# Patient Record
Sex: Male | Born: 1975 | Race: White | Hispanic: No | Marital: Married | Smoking: Never smoker
Health system: Southern US, Community
[De-identification: ages and names within clinical notes are randomized; demographics above are authoritative.]

## PROBLEM LIST (undated history)

## (undated) DIAGNOSIS — Z9089 Acquired absence of other organs: Secondary | ICD-10-CM

## (undated) HISTORY — DX: Acquired absence of other organs: Z90.89

## (undated) HISTORY — PX: TONSILLECTOMY: SUR1361

---

## 2018-07-22 ENCOUNTER — Ambulatory Visit: Payer: Self-pay | Admitting: Adult Health

## 2018-07-22 ENCOUNTER — Encounter: Payer: Self-pay | Admitting: Adult Health

## 2018-07-22 VITALS — BP 116/90 | HR 101 | Temp 99.4°F | Resp 16 | Ht 71.0 in | Wt 211.0 lb

## 2018-07-22 DIAGNOSIS — J101 Influenza due to other identified influenza virus with other respiratory manifestations: Secondary | ICD-10-CM

## 2018-07-22 DIAGNOSIS — R6889 Other general symptoms and signs: Secondary | ICD-10-CM

## 2018-07-22 LAB — POCT INFLUENZA A/B
Influenza A, POC: POSITIVE — AB
Influenza B, POC: NEGATIVE

## 2018-07-22 MED ORDER — OSELTAMIVIR PHOSPHATE 75 MG PO CAPS: 75.0000 mg | ORAL_CAPSULE | Freq: Two times a day (BID) | ORAL | 0 refills | Status: DC

## 2018-07-22 MED ORDER — BENZONATATE 100 MG PO CAPS: 100.0000 mg | ORAL_CAPSULE | Freq: Two times a day (BID) | ORAL | 0 refills | Status: DC | PRN

## 2018-07-22 NOTE — Patient Instructions (Addendum)
Advised patient call the office or your primary care doctor for an appointment if no improvement within 72 hours or if any symptoms change or worsen at any time  Advised ER or urgent Care if after hours or on weekend. Call 911 for emergency symptoms at any time.Patinet verbalized understanding of all instructions given/reviewed and treatment plan and has no further questions or concerns at this time.      Influenza, Adult Influenza is also called "the flu." It is an infection in the lungs, nose, and throat (respiratory tract). It is caused by a virus. The flu causes symptoms that are similar to symptoms of a cold. It also causes a high fever and body aches. The flu spreads easily from person to person (is contagious). Getting a flu shot (influenza vaccination) every year is the best way to prevent the flu. What are the causes? This condition is caused by the influenza virus. You can get the virus by:  Breathing in droplets that are in the air from the cough or sneeze of a person who has the virus.  Touching something that has the virus on it (is contaminated) and then touching your mouth, nose, or eyes. What increases the risk? Certain things may make you more likely to get the flu. These include:  Not washing your hands often.  Having close contact with many people during cold and flu season.  Touching your mouth, eyes, or nose without first washing your hands.  Not getting a flu shot every year. You may have a higher risk for the flu, along with serious problems such as a lung infection (pneumonia), if you:  Are older than 65.  Are pregnant.  Have a weakened disease-fighting system (immune system) because of a disease or taking certain medicines.  Have a long-term (chronic) illness, such as: ? Heart, kidney, or lung disease. ? Diabetes. ? Asthma.  Have a liver disorder.  Are very overweight (morbidly obese).  Have anemia. This is a condition that affects your red blood  cells. What are the signs or symptoms? Symptoms usually begin suddenly and last 4-14 days. They may include:  Fever and chills.  Headaches, body aches, or muscle aches.  Sore throat.  Cough.  Runny or stuffy (congested) nose.  Chest discomfort.  Not wanting to eat as much as normal (poor appetite).  Weakness or feeling tired (fatigue).  Dizziness.  Feeling sick to your stomach (nauseous) or throwing up (vomiting). How is this treated? If the flu is found early, you can be treated with medicine that can help reduce how bad the illness is and how long it lasts (antiviral medicine). This may be given by mouth (orally) or through an IV tube. Taking care of yourself at home can help your symptoms get better. Your doctor may suggest:  Taking over-the-counter medicines.  Drinking plenty of fluids. The flu often goes away on its own. If you have very bad symptoms or other problems, you may be treated in a hospital. Follow these instructions at home:     Activity  Rest as needed. Get plenty of sleep.  Stay home from work or school as told by your doctor. ? Do not leave home until you do not have a fever for 24 hours without taking medicine. ? Leave home only to visit your doctor. Eating and drinking  Take an ORS (oral rehydration solution). This is a drink that is sold at pharmacies and stores.  Drink enough fluid to keep your pee (urine) pale yellow.  Drink clear fluids in small amounts as you are able. Clear fluids include: ? Water. ? Ice chips. ? Fruit juice that has water added (diluted fruit juice). ? Low-calorie sports drinks.  Eat bland, easy-to-digest foods in small amounts as you are able. These foods include: ? Bananas. ? Applesauce. ? Rice. ? Lean meats. ? Toast. ? Crackers.  Do not eat or drink: ? Fluids that have a lot of sugar or caffeine. ? Alcohol. ? Spicy or fatty foods. General instructions  Take over-the-counter and prescription medicines  only as told by your doctor.  Use a cool mist humidifier to add moisture to the air in your home. This can make it easier for you to breathe.  Cover your mouth and nose when you cough or sneeze.  Wash your hands with soap and water often, especially after you cough or sneeze. If you cannot use soap and water, use alcohol-based hand sanitizer.  Keep all follow-up visits as told by your doctor. This is important. How is this prevented?   Get a flu shot every year. You may get the flu shot in late summer, fall, or winter. Ask your doctor when you should get your flu shot.  Avoid contact with people who are sick during fall and winter (cold and flu season). Contact a doctor if:  You get new symptoms.  You have: ? Chest pain. ? Watery poop (diarrhea). ? A fever.  Your cough gets worse.  You start to have more mucus.  You feel sick to your stomach.  You throw up. Get help right away if you:  Have shortness of breath.  Have trouble breathing.  Have skin or nails that turn a bluish color.  Have very bad pain or stiffness in your neck.  Get a sudden headache.  Get sudden pain in your face or ear.  Cannot eat or drink without throwing up. Summary  Influenza ("the flu") is an infection in the lungs, nose, and throat. It is caused by a virus.  Take over-the-counter and prescription medicines only as told by your doctor.  Getting a flu shot every year is the best way to avoid getting the flu. This information is not intended to replace advice given to you by your health care provider. Make sure you discuss any questions you have with your health care provider. Document Released: 04/01/2008 Document Revised: 12/09/2017 Document Reviewed: 12/09/2017 Elsevier Interactive Patient Education  2019 [employer] Preventing Influenza, Adult Influenza, more commonly known as "the flu," is a viral infection that mainly affects the respiratory tract. The respiratory tract includes  structures that help you breathe, such as the lungs, nose, and throat. The flu causes many common cold symptoms, as well as a high fever and body aches. The flu spreads easily from person to person (is contagious). The flu is most common from December through March. This is called flu season.You can catch the flu virus by:  Breathing in droplets from an infected person's cough or sneeze.  Touching something that was recently contaminated with the virus and then touching your mouth, nose, or eyes. What can I do to lower my risk?        You can decrease your risk of getting the flu by:  Getting a flu shot (influenza vaccination) every year. This is the best way to prevent the flu. A flu shot is recommended for everyone age 45 months and older. ? It is best to get a flu shot in the fall, as soon as  it is available. Getting a flu shot during winter or spring instead is still a good idea. Flu season can last into early spring. ? Preventing the flu through vaccination requires getting a new flu shot every year. This is because the flu virus changes slightly (mutates) from one year to the next. Even if a flu shot does not completely protect you from all flu virus mutations, it can reduce the severity of your illness and prevent dangerous complications of the flu. ? If you are pregnant, you can and should get a flu shot. ? If you have had a reaction to the shot in the past or if you are allergic to eggs, check with your health care provider before getting a flu shot. ? Sometimes the vaccine is available as a nasal spray. In some years, the nasal spray has not been as effective against the flu virus. Check with your health care provider if you have questions about this.  Practicing good health habits. This is especially important during flu season. ? Avoid contact with people who are sick with flu or cold symptoms. ? Wash your hands with soap and water often. If soap and water are not available, use  alcohol-based hand sanitizer. ? Avoid touching your hands to your face, especially when you have not washed your hands recently. ? Use a disinfectant to clean surfaces at home and at work that may be contaminated with the flu virus. ? Keep your body's disease-fighting system (immune system) in good shape by eating a healthy diet, drinking plenty of fluids, getting enough sleep, and exercising regularly. If you do get the flu, avoid spreading it to others by:  Staying home until your symptoms have been gone for at least one day.  Covering your mouth and nose when you cough or sneeze.  Avoiding close contact with others, especially babies and elderly people. Why are these changes important? Getting a flu shot and practicing good health habits protects you as well as other people. If you get the flu, your friends, family, and co-workers are also at risk of getting it, because it spreads so easily to others. Each year, about 2 out of every 10 people get the flu. Having the flu can lead to complications, such as pneumonia, ear infection, and sinus infection. The flu also can be deadly, especially for babies, people older than age 19, and people who have serious long-term diseases. How is this treated? Most people recover from the flu by resting at home and drinking plenty of fluids. However, a prescription antiviral medicine may reduce your flu symptoms and may make your flu go away sooner. This medicine must be started within a few days of getting flu symptoms. You can talk with your health care provider about whether you need an antiviral medicine. Antiviral medicine may be prescribed for people who are at risk for more serious flu symptoms. This includes people who:  Are older than age 54.  Are pregnant.  Have a condition that makes the flu worse or more dangerous. Where to find more information  Centers for Disease Control and Prevention: tsavxtf.com  ItsBlog.fr:  InternetEnthusiasts.hu  American Academy of [employer] Physicians: familydoctor.org/familydoctor/en/kids/vaccines/preventing-the-flu.html Contact a health care provider if:  You have influenza and you develop new symptoms.  You have: ? Chest pain. ? Diarrhea. ? A fever.  Your cough gets worse, or you produce more mucus. Summary  The best way to prevent the flu is to get a flu shot every year in the fall.  Even if you get the flu after you have received the yearly vaccine, your flu may be milder and go away sooner because of your flu shot.  If you get the flu, antiviral medicines that are started with a few days of symptoms may reduce your flu symptoms and may make your flu go away sooner.  You can also help prevent the flu by practicing good health habits. This information is not intended to replace advice given to you by your health care provider. Make sure you discuss any questions you have with your health care provider. Document Released: 07/08/2015 Document Revised: 05/22/2017 Document Reviewed: 03/01/2016 Elsevier Interactive Patient Education  2019 [employer] Inc. Oseltamivir capsules What is this medicine? OSELTAMIVIR (os el TAM i vir) is an antiviral medicine. It is used to prevent and to treat some kinds of influenza or the flu. It will not work for colds or other viral infections. This medicine may be used for other purposes; ask your health care provider or pharmacist if you have questions. COMMON BRAND NAME(S): Tamiflu What should I tell my health care provider before I take this medicine? They need to know if you have any of the following conditions: -heart disease -immune system problems -kidney disease -liver disease -lung disease -an unusual or allergic reaction to oseltamivir, other medicines, foods, dyes, or preservatives -pregnant or trying to get pregnant -breast-feeding How should I use this medicine? Take this medicine by mouth with a glass of  water. Follow the directions on the prescription label. Start this medicine at the first sign of flu symptoms. You can take it with or without food. If it upsets your stomach, take it with food. Take your medicine at regular intervals. Do not take your medicine more often than directed. Take all of your medicine as directed even if you think you are better. Do not skip doses or stop your medicine early. Talk to your pediatrician regarding the use of this medicine in children. While this drug may be prescribed for children as young as 14 days for selected conditions, precautions do apply. Overdosage: If you think you have taken too much of this medicine contact a poison control center or emergency room at once. NOTE: This medicine is only for you. Do not share this medicine with others. What if I miss a dose? If you miss a dose, take it as soon as you remember. If it is almost time for your next dose (within 2 hours), take only that dose. Do not take double or extra doses. What may interact with this medicine? - intranasal influenza vaccine This list may not describe all possible interactions. Give your health care provider a list of all the medicines, herbs, non-prescription drugs, or dietary supplements you use. Also tell them if you smoke, drink alcohol, or use illegal drugs. Some items may interact with your medicine. What should I watch for while using this medicine? Visit your doctor or health care professional for regular check ups. Tell your doctor if your symptoms do not start to get better or if they get worse. If you have the flu, you may be at an increased risk of developing seizures, confusion, or abnormal behavior. This occurs early in the illness, and more frequently in children and teens. These events are not common, but may result in accidental injury to the patient. Families and caregivers of patients should watch for signs of unusual behavior and contact a doctor or health care  professional right away if the patient shows signs  of unusual behavior. This medicine is not a substitute for the flu shot. Talk to your doctor each year about an annual flu shot. What side effects may I notice from receiving this medicine? Side effects that you should report to your doctor or health care professional as soon as possible: -allergic reactions like skin rash, itching or hives, swelling of the face, lips, or tongue -anxiety, confusion, unusual behavior -breathing problems -hallucination, loss of contact with reality -redness, blistering, peeling or loosening of the skin, including inside the mouth -seizures Side effects that usually do not require medical attention (report to your doctor or health care professional if they continue or are bothersome): -diarrhea -headache -nausea, vomiting -pain This list may not describe all possible side effects. Call your doctor for medical advice about side effects. You may report side effects to FDA at 1-800-FDA-1088. Where should I keep my medicine? Keep out of the reach of children. Store at room temperature between 15 and 30 degrees C (59 and 86 degrees F). Throw away any unused medicine after the expiration date. NOTE: This sheet is a summary. It may not cover all possible information. If you have questions about this medicine, talk to your doctor, pharmacist, or health care provider.  2019 Elsevier/Gold Standard (2016-12-03 16:45:14) Benzonatate capsules What is this medicine? BENZONATATE (ben ZOE na tate) is used to treat cough. This medicine may be used for other purposes; ask your health care provider or pharmacist if you have questions. COMMON BRAND NAME(S): Tessalon Perles, Zonatuss What should I tell my health care provider before I take this medicine? They need to know if you have any of these conditions: -kidney or liver disease -an unusual or allergic reaction to benzonatate, anesthetics, other medicines, foods, dyes, or  preservatives -pregnant or trying to get pregnant -breast-feeding How should I use this medicine? Take this medicine by mouth with a glass of water. Follow the directions on the prescription label. Avoid breaking, chewing, or sucking the capsule, as this can cause serious side effects. Take your medicine at regular intervals. Do not take your medicine more often than directed. Talk to your pediatrician regarding the use of this medicine in children. While this drug may be prescribed for children as young as 41 years old for selected conditions, precautions do apply. Overdosage: If you think you have taken too much of this medicine contact a poison control center or emergency room at once. NOTE: This medicine is only for you. Do not share this medicine with others. What if I miss a dose? If you miss a dose, take it as soon as you can. If it is almost time for your next dose, take only that dose. Do not take double or extra doses. What may interact with this medicine? Do not take this medicine with any of the following medications: -MAOIs like Carbex, Eldepryl, Marplan, Nardil, and Parnate This list may not describe all possible interactions. Give your health care provider a list of all the medicines, herbs, non-prescription drugs, or dietary supplements you use. Also tell them if you smoke, drink alcohol, or use illegal drugs. Some items may interact with your medicine. What should I watch for while using this medicine? Tell your doctor if your symptoms do not improve or if they get worse. If you have a high fever, skin rash, or headache, see your health care professional. You may get drowsy or dizzy. Do not drive, use machinery, or do anything that needs mental alertness until you know how this medicine  affects you. Do not sit or stand up quickly, especially if you are an older patient. This reduces the risk of dizzy or fainting spells. What side effects may I notice from receiving this  medicine? Side effects that you should report to your doctor or health care professional as soon as possible: -allergic reactions like skin rash, itching or hives, swelling of the face, lips, or tongue -breathing problems -chest pain -confusion or hallucinations -irregular heartbeat -numbness of mouth or throat -seizures Side effects that usually do not require medical attention (report to your doctor or health care professional if they continue or are bothersome): -burning feeling in the eyes -constipation -headache -nasal congestion -stomach upset This list may not describe all possible side effects. Call your doctor for medical advice about side effects. You may report side effects to FDA at 1-800-FDA-1088. Where should I keep my medicine? Keep out of the reach of children. Store at room temperature between 15 and 30 degrees C (59 and 86 degrees F). Keep tightly closed. Protect from light and moisture. Throw away any unused medicine after the expiration date. NOTE: This sheet is a summary. It may not cover all possible information. If you have questions about this medicine, talk to your doctor, pharmacist, or health care provider.  2019 Elsevier/Gold Standard (2007-09-22 14:52:56)   Fever, Adult     A fever is an increase in your body's temperature. It often means a temperature of 100.48F (38C) or higher. Brief mild or moderate fevers often have no long-term effects. They often do not need treatment. Moderate or high fevers may make you feel uncomfortable. Sometimes, they can be a sign of a serious illness or disease. A fever that keeps coming back or that lasts a long time may cause you to lose water in your body (get dehydrated). You can take your temperature with a thermometer to see if you have a fever. Temperature can change with:  Age.  Time of day.  Where the thermometer is put in the body. Readings may vary when the thermometer is put: ? In the mouth (oral). ? In the  butt (rectal). ? In the ear (tympanic). ? Under the arm (axillary). ? On the forehead (temporal). Follow these instructions at home: Medicines  Take over-the-counter and prescription medicines only as told by your doctor. Follow the dosing instructions carefully.  If you were prescribed an antibiotic medicine, take it as told by your doctor. Do not stop taking it even if you start to feel better. General instructions  Watch for any changes in your symptoms. Tell your doctor about them.  Rest as needed.  Drink enough fluid to keep your pee (urine) pale yellow.  Sponge yourself or bathe with room-temperature water as needed. This helps to lower your body temperature. Do not use ice water.  Do not use too many blankets or wear clothes that are too heavy.  If your fever was caused by an infection that spreads from person to person (is contagious), such as a cold or the flu: ? You should stay home from work and public places for at least 24 hours after your fever is gone. ? Your fever should be gone for at least 24 hours without the need to use medicines. Contact a doctor if:  You throw up (vomit).  You cannot eat or drink without throwing up.  You have watery poop (diarrhea).  It hurts when you pee.  Your symptoms do not get better with treatment.  You have new symptoms.  You feel very weak. Get help right away if:  You are short of breath or have trouble breathing.  You are dizzy or you pass out (faint).  You feel mixed up (confused).  You have signs of not having enough water in your body, such as: ? Dark pee, very little pee, or no pee. ? Cracked lips. ? Dry mouth. ? Sunken eyes. ? Sleepiness. ? Weakness.  You have very bad pain in your belly (abdomen).  You keep throwing up or having watery poop.  You have a rash on your skin.  Your symptoms get worse all of a sudden. Summary  A fever is an increase in your body's temperature. It often means a  temperature of 100.9F (38C) or higher.  Watch for any changes in your symptoms. Tell your doctor about them.  Take all medicines only as told by your doctor.  Do not go to work or other public places if your fever was caused by an illness that can spread to other people.  Get help right away if you have signs that you do not have enough water in your body. This information is not intended to replace advice given to you by your health care provider. Make sure you discuss any questions you have with your health care provider. Document Released: 04/01/2008 Document Revised: 12/07/2017 Document Reviewed: 12/07/2017 Elsevier Interactive Patient Education  2019 [employer]

## 2018-07-22 NOTE — Progress Notes (Signed)
Subjective:     Patient ID: Kurt Morse, male   DOB: 08/10/1975, 43 y.o.   MRN: [id number]  Blood pressure 116/90, pulse (!) 101, temperature 99.4 F (37.4 C), resp. rate 16, height 5\' 11"  (1.803 m), weight 211 lb (95.7 kg), SpO2 98 %. Influenza  This is a new problem. The current episode started yesterday. The problem occurs intermittently. The problem has been unchanged. Associated symptoms include congestion, coughing, fatigue, a fever and a sore throat. Pertinent negatives include no chills, diaphoresis or neck pain. He has tried NSAIDs for the symptoms. The treatment provided moderate relief.    No Known Allergies  Patient is a 43 year old male in no acute distress who comes to the clinic with complaints of body aches, chills, sore throat and rhinorrhea that started yesterday 07/21/17. He reports 103 fever last night responded to 400mg  of Motrin and hydration. He has 99.4 this morning and no Motrin since last night.   Mild nasal congestion- clear. No chest congestion per patient. Intermittent cough non productive.  Patient  denies any rash, chest pain, shortness of breath, nausea, vomiting, or diarrhea.   Denies any recent illness or hospitalization.  He reports son had stomach visit for three days beginning of this week and is resolved now. He denies any gastrointestinal symptoms himself.  Denies any wheezing.   Review of Systems  Constitutional: Positive for fatigue and fever. Negative for activity change, appetite change, chills, diaphoresis and unexpected weight change.  HENT: Positive for congestion, postnasal drip, rhinorrhea and sore throat. Negative for dental problem, drooling, ear discharge, ear pain, facial swelling, hearing loss, mouth sores, nosebleeds, sinus pressure, sinus pain, sneezing, tinnitus, trouble swallowing and voice change.   Eyes: Negative.   Respiratory: Positive for cough. Negative for apnea, choking, chest tightness, shortness of breath, wheezing and  stridor.   Gastrointestinal: Negative.   Genitourinary: Negative.   Musculoskeletal: Negative.  Negative for neck pain.  Skin: Negative.   Neurological: Negative.   Hematological: Negative.   Psychiatric/Behavioral: Negative.        Objective:   Physical Exam Vitals signs reviewed.  Constitutional:      General: He is awake. He is not in acute distress.    Appearance: Normal appearance. He is well-developed and well-groomed. He is not ill-appearing, toxic-appearing or diaphoretic.     Comments: Patient is alert and oriented and responsive to questions Engages in eye contact with provider. Speaks in full sentences without any pauses without any shortness of breath or distress.   Patient moves on and off of exam table and in room without difficulty. Gait is normal in hall and in room. Patient is oriented to person place time and situation. Patient answers questions appropriately and engages in conversation.   HENT:     Head: Normocephalic and atraumatic.     Right Ear: Hearing, tympanic membrane, ear canal and external ear normal. There is no impacted cerumen. Tympanic membrane is not erythematous.     Left Ear: Hearing, tympanic membrane, ear canal and external ear normal. There is no impacted cerumen. Tympanic membrane is not erythematous.     Nose: Congestion and rhinorrhea present. No nasal deformity, nasal tenderness or mucosal edema.     Right Turbinates: Not enlarged or swollen.     Left Turbinates: Not enlarged or swollen.     Right Sinus: No maxillary sinus tenderness or frontal sinus tenderness.     Left Sinus: No maxillary sinus tenderness or frontal sinus tenderness.  Mouth/Throat:     Mouth: Mucous membranes are moist.     Pharynx: Oropharynx is clear. Uvula midline. No pharyngeal swelling, oropharyngeal exudate, posterior oropharyngeal erythema or uvula swelling.  Eyes:     General: Lids are normal. No scleral icterus.       Right eye: No discharge.        Left eye:  No discharge.     Extraocular Movements: Extraocular movements intact.     Conjunctiva/sclera: Conjunctivae normal.     Pupils: Pupils are equal, round, and reactive to light.  Neck:     Musculoskeletal: Normal range of motion and neck supple. No neck rigidity or muscular tenderness.     Vascular: No carotid bruit.  Cardiovascular:     Rate and Rhythm: Normal rate and regular rhythm.     Pulses: Normal pulses.     Heart sounds: Normal heart sounds. No murmur. No friction rub. No gallop.   Pulmonary:     Effort: Pulmonary effort is normal. No respiratory distress.     Breath sounds: Normal breath sounds. No stridor. No wheezing, rhonchi or rales.     Comments: No adventitious lung sounds  Chest:     Chest wall: No tenderness.  Abdominal:     Palpations: Abdomen is soft.  Musculoskeletal: Normal range of motion.  Lymphadenopathy:     Head:     Right side of head: No submental, submandibular, tonsillar, preauricular, posterior auricular or occipital adenopathy.     Left side of head: No submental, submandibular, tonsillar, preauricular, posterior auricular or occipital adenopathy.     Cervical: No cervical adenopathy.     Right cervical: No superficial, deep or posterior cervical adenopathy.    Left cervical: No superficial, deep or posterior cervical adenopathy.  Skin:    General: Skin is warm.     Capillary Refill: Capillary refill takes less than 2 seconds.     Nails: There is no clubbing.   Neurological:     General: No focal deficit present.     Mental Status: He is alert and oriented to person, place, and time.     GCS: GCS eye subscore is 4. GCS verbal subscore is 5. GCS motor subscore is 6.     Cranial Nerves: Cranial nerves are intact. No cranial nerve deficit.     Sensory: Sensation is intact. No sensory deficit.     Motor: Motor function is intact. No weakness.     Coordination: Coordination is intact. Coordination normal.     Gait: Gait is intact. Gait normal.      Deep Tendon Reflexes: Reflexes normal.  Psychiatric:        Mood and Affect: Mood normal.        Behavior: Behavior normal. Behavior is cooperative.        Thought Content: Thought content normal.        Judgment: Judgment normal.        Assessment:     Influenza A  Flu-like symptoms - Plan: POCT Influenza A/B  Results for orders placed or performed in visit on 07/22/18 (from the past 24 hour(s))  POCT Influenza A/B     Status: Abnormal   Collection Time: 07/22/18 10:46 AM  Result Value Ref Range   Influenza A, POC Positive (A) Negative   Influenza B, POC Negative Negative       Plan:     Meds ordered this encounter  Medications  . oseltamivir (TAMIFLU) 75 MG capsule    Sig: Take  1 capsule (75 mg total) by mouth 2 (two) times daily.    Dispense:  10 capsule    Refill:  0  . benzonatate (TESSALON) 100 MG capsule    Sig: Take 1 capsule (100 mg total) by mouth 2 (two) times daily as needed for cough.    Dispense:  20 capsule    Refill:  0   Return to clinic or seek medical attention if fever persists more than 3 days or any symptoms worsen, fever over 103 oral at anytime call the office or be seen at open facility. Fever should continue to decrease. Check temperature without fever reducers as well before taking more medications.  Gave and reviewed After Visit Summary(AVS) with patient.  Advised patient call the office or your primary care doctor for an appointment if no improvement within 72 hours or if any symptoms change or worsen at any time  Advised ER or urgent Care if after hours or on weekend. Call 911 for emergency symptoms at any time.Patinet verbalized understanding of all instructions given/reviewed and treatment plan and has no further questions or concerns at this time.    Patient verbalized understanding of all instructions given and denies any further questions at this time.

## 2019-05-04 ENCOUNTER — Ambulatory Visit: Payer: Self-pay

## 2019-05-04 ENCOUNTER — Other Ambulatory Visit: Payer: Self-pay

## 2019-05-04 DIAGNOSIS — Z23 Encounter for immunization: Secondary | ICD-10-CM

## 2020-07-18 ENCOUNTER — Ambulatory Visit: Payer: Self-pay | Admitting: Medical

## 2020-07-18 ENCOUNTER — Other Ambulatory Visit: Payer: Self-pay

## 2020-07-18 VITALS — HR 60 | Temp 98.2°F | Resp 16

## 2020-07-18 DIAGNOSIS — L237 Allergic contact dermatitis due to plants, except food: Secondary | ICD-10-CM

## 2020-07-18 MED ORDER — PREDNISONE 10 MG (21) PO TBPK: ORAL_TABLET | ORAL | 0 refills | Status: DC

## 2020-07-18 NOTE — Patient Instructions (Signed)
OTC Zyrtec daily, OTC Motrin 600 mg twice daily with food. Cool showers, avoid heat to the arms.   Poison Oak Dermatitis  Poison oak dermatitis is redness and soreness (inflammation) of the skin caused by chemicals in the leaves of the poison oak plant. You may have very bad itching, swelling, a rash, and blisters. What are the causes? You may get this condition by:  Touching a poison oak plant.  Touching something that has the chemical from the leaves on it. This may include animals or objects that have come in contact with the plant. What increases the risk? You are more likely to get this condition if you:  Go outdoors often in wooded or marshy areas.  Go outdoors without wearing protective clothing, such as closed shoes, long pants, and a long-sleeved shirt. What are the signs or symptoms? Symptoms of this condition include:  Redness of the skin.  Very bad itching.  A rash that often includes bumps and blisters. ? The rash usually appears 48 hours after exposure if you have been exposed before. ? If this is the first time you have been exposed, the rash may not appear until a week after exposure.  Swelling. This may occur if the reaction is very bad. Symptoms often clear up in 1-2 weeks. The first time you develop this condition, symptoms may last 3-4 weeks. How is this treated? This condition may be treated with:  Hydrocortisone creams or calamine lotions to help with itching.  Oatmeal baths to soothe the skin.  Medicines to help reduce itching (antihistamines). If you have a very bad reaction, you may also be given steroid medicines. Follow these instructions at home: Medicines  Take or apply over-the-counter and prescription medicines only as told by your doctor.  Use hydrocortisone creams or calamine lotion as needed to help with itching. General instructions  Do not scratch or rub your skin.  Put a cold, wet cloth (cold compress) on the affected areas or  take baths in cool water. This will help with itching.  Avoid hot baths and showers.  Take oatmeal baths as needed. Use colloidal oatmeal. You can get this at a pharmacy or grocery store. Follow the instructions on the package.  While you have the rash, wash your clothes right after you wear them.  Keep all follow-up visits as told by your doctor. This is important. How is this prevented?  Know what poison oak looks like so you can avoid it. ? This plant has three leaves with flowering branches on a single stem. ? The leaves are fuzzy. ? The edges of the leaves look like teeth.  If you have touched poison oak, wash your skin with soap and water right away. Be sure to wash under your fingernails.  When hiking or camping, wear long pants, a long-sleeved shirt, tall socks, and hiking boots. You can also use a lotion on your skin that helps to prevent contact with the chemical on the plant.  If you think that your clothes or outdoor gear came in contact with poison oak, rinse them off with a garden hose before you bring them inside your house.  When doing yard work or gardening, wear gloves, long sleeves, long pants, and boots. Wash your garden tools and gloves if they come in contact with poison oak.  If you think that your pet has come into contact with poison oak, wash him or her with pet shampoo and water. Make sure you wear gloves while washing your pet.  Do not burn poison oak plants. This can release the chemical from the plant into the air and may cause a reaction.   Contact a doctor if:  You have open sores in the rash area.  You have more redness, swelling, or pain in the affected area.  You have redness that spreads beyond the rash area.  You have fluid, blood, or pus coming from the affected area.  You have a fever.  You have a rash over a large area of your body.  You have a rash on your eyes, mouth, or genitals.  Your rash does not improve after a few weeks. Get  help right away if:  Your face swells or your eyes swell shut.  You have trouble breathing.  You have trouble swallowing. These symptoms may be an emergency. Do not wait to see if the symptoms will go away. Get medical help right away. Call your local emergency services (911 in the [place] Do not drive yourself to the hospital. Summary  Poison oak dermatitis is redness and soreness of the skin caused by chemicals in the leaves of the poison oak plant.  Symptoms of this condition include redness, very bad itching, a rash, and swelling.  Do not scratch or rub your skin.  Take or apply over-the-counter and prescription medicines only as told by your doctor. This information is not intended to replace advice given to you by your health care provider. Make sure you discuss any questions you have with your health care provider. Document Revised: 10/15/2018 Document Reviewed: 07/23/2018 Elsevier Patient Education  2021 [employer].

## 2020-07-18 NOTE — Progress Notes (Signed)
   Subjective:    Patient ID: Kurt Morse, male    DOB: 01/15/1976, 45 y.o.   MRN: [id number]  HPI 45 yo male in non acute , presents to day with rash sto upper extremities bilaterally x 10 days.  He was doing yard work , removing a tree and he did not know it was a poison oak tree. He wore gloves but had on a short sleeve shirt. He has been treating the ara with hydrocoritit sone cream.  Pulse 60, temperature 98.2 F (36.8 C), temperature source Oral, resp. rate 16, SpO2 100 %.   No Known Allergies    Review of Systems Mildly itchy currently, on both upper and lower arms. Areas are drying up. Not painful.    Objective:   Physical Exam Vitals and nursing note reviewed.  Constitutional:      Appearance: Normal appearance.  HENT:     Head: Normocephalic.  Musculoskeletal:       Arms:  Skin:    General: Skin is warm and dry.     Findings: Erythema and rash present. No lesion. Rash is crusting and urticarial (not now but was in the early stages of the rash.).     Comments: Swelling noted in forearms bilaterally L>R Very angry, no signs of discharge or infection.  Neurological:     General: No focal deficit present.     Mental Status: He is alert and oriented to person, place, and time. Mental status is at baseline.  Psychiatric:        Mood and Affect: Mood normal.        Behavior: Behavior normal.        Thought Content: Thought content normal.        Judgment: Judgment normal.           Assessment & Plan:  Contact dermatis , Poison Oak. Avoid heat, elevate arms with pillows, cool showers. OTC Zyrtec per package instructions, stop Allegra while taking Zyrtec., Motirn 600 mg twice a day, with food. If exposed again to a plant that causes dermatitis I recommend he see a medical provider sooner. Meds ordered this encounter  Medications  . predniSONE (STERAPRED UNI-PAK 21 TAB) 10 MG (21) TBPK tablet    Sig: Take 6 tablets by mouth today then 5 tablets tomorrow  then one less every day thereafter. Take with food.    Dispense:  21 tablet    Refill:  0   call if worsening or s/s of infection ( reviewed with patient). Also call if it is not mostly healed for a virtual appointment.  Patient verbalizes understanding and has no questions at discharge.

## 2020-08-27 ENCOUNTER — Encounter: Payer: Self-pay | Admitting: Medical

## 2020-08-27 ENCOUNTER — Telehealth: Payer: Self-pay | Admitting: Medical

## 2020-08-27 ENCOUNTER — Other Ambulatory Visit: Payer: Self-pay

## 2020-08-27 DIAGNOSIS — L237 Allergic contact dermatitis due to plants, except food: Secondary | ICD-10-CM

## 2020-08-27 DIAGNOSIS — L255 Unspecified contact dermatitis due to plants, except food: Secondary | ICD-10-CM

## 2020-08-27 MED ORDER — PREDNISONE 10 MG (21) PO TBPK: ORAL_TABLET | ORAL | 0 refills | Status: DC

## 2020-08-27 NOTE — Progress Notes (Signed)
   Subjective:    Patient ID: Kurt Morse, male    DOB: [birth year], 45 y.o.   MRN: [id number]  HPI 45 yo male in non acute distress, consents to telemedicine appointment. Started 3-4 daysago. He was doing yard work and came in contact with a plant that has caused rash to his right wrist,  upper arm ( around the entire arm) and a line across chest and few spots on his back . It is itchy but does not hurt. I encouraged patient last visit to not to wait 1.5 weeks due to his allergic reaction.   1st phone call  No answer 2pm. Patient called  2:11pm.   Review of Systems  Skin: Positive for rash (itchy r upper arm , chest and a little on his back).       Objective:   Physical Exam  AXOX3 No physical exam performed due to telemedicine appointment.      Assessment & Plan:  Contact Dermatitis. Right arm, chest and back. Meds ordered this encounter  Medications  . predniSONE (STERAPRED UNI-PAK 21 TAB) 10 MG (21) TBPK tablet    Sig: Take 6 tablets by mouth today then 5 tablets tomorrow then one less every day thereafter. Take with food.    Dispense:  21 tablet    Refill:  0  cool/luke warm showers. OTC Zyrtec during the day and benadryl at night. Loose comfortable clothing. Patient verbalizes understanding and has no questions at discharge.

## 2020-09-07 ENCOUNTER — Ambulatory Visit: Payer: Self-pay | Admitting: Nurse Practitioner

## 2020-09-07 ENCOUNTER — Other Ambulatory Visit
Admission: RE | Admit: 2020-09-07 | Discharge: 2020-09-07 | Disposition: A | Discharge status: Home / Self Care | Payer: BC Managed Care – PPO | Source: Ambulatory Visit | Attending: Ophthalmology | Admitting: Ophthalmology

## 2020-09-07 ENCOUNTER — Other Ambulatory Visit: Payer: Self-pay

## 2020-09-07 ENCOUNTER — Encounter: Payer: Self-pay | Admitting: Nurse Practitioner

## 2020-09-07 VITALS — BP 124/92 | HR 60 | Temp 97.3°F | Resp 16

## 2020-09-07 DIAGNOSIS — H02401 Unspecified ptosis of right eyelid: Secondary | ICD-10-CM | POA: Insufficient documentation

## 2020-09-07 LAB — TSH: TSH: 1.163 u[IU]/mL (ref 0.350–4.500)

## 2020-09-07 LAB — T4, FREE: Free T4: 0.95 ng/dL (ref 0.61–1.12)

## 2020-09-07 NOTE — Progress Notes (Signed)
   Subjective:    Patient ID: Kurt Morse, male    DOB: 13-Mar-1976, 45 y.o.   MRN: [id number]  HPI  45 year old male presenting to clinic with complaints of right eyelid swelling that started 3 days ago. He had slept in his contacts the night prior to this starting.   Denies any visual changes, denies itching or discharge, denies redness, denies pain.   At rest his eyelid stays down almost covering his right eye.   He was on a prednisone pack that ended the day prior to eyelid swelling. Steroids were for poison sumac contact. Rash was concentrated to right upper arm with some spreading across chest and across midline.   Rash was the same as past exposures, he knows he has sumac in his backyard.   Today's Vitals   09/07/20 0905  BP: (!) 124/92  Pulse: 60  Resp: 16  Temp: (!) 97.3 F (36.3 C)  TempSrc: Tympanic  SpO2: 99%   There is no height or weight on file to calculate BMI.  Review of Systems  Constitutional: Negative.   HENT: Negative.   Eyes:       Right upper eyelid drooping.   Respiratory: Negative.   Cardiovascular: Negative.   Gastrointestinal: Negative.   Skin: Positive for rash.  Neurological: Negative.        Objective:   Physical Exam Eyes:     General: Lids are everted, no foreign bodies appreciated. Vision grossly intact.     Extraocular Movements: Extraocular movements intact.     Conjunctiva/sclera: Conjunctivae normal.     Pupils: Pupils are equal, round, and reactive to light.      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 Patient is able to open eyelid with force, but when relaxed it does cover pupil.   Neurological:     Mental Status: He is alert and oriented to person, place, and time.     Sensory: Sensation is intact.     Comments: Smile symmetrical tongue midline.             Assessment & Plan:  Discussed with patient that a referral to opthalmology is recommended at this time to assess for underlying  pathology of ptosis.   Eye exam overall WNL aside from eyelid drooping.   Patient scheduled with [facility] today at 2:15pm follow up at clinic as needed

## 2020-09-08 LAB — T3: T3, Total: 106 ng/dL (ref 71–180)

## 2020-09-11 LAB — ACETYLCHOLINE RECEPTOR AB, ALL
Acety choline binding ab: 74 nmol/L — ABNORMAL HIGH (ref 0.00–0.24)
Acetylchol Block Ab: 36 % — ABNORMAL HIGH (ref 0–25)

## 2020-09-13 ENCOUNTER — Other Ambulatory Visit: Payer: Self-pay | Admitting: Neurology

## 2020-09-13 ENCOUNTER — Other Ambulatory Visit (HOSPITAL_COMMUNITY): Payer: Self-pay | Admitting: Neurology

## 2020-09-13 DIAGNOSIS — H02401 Unspecified ptosis of right eyelid: Secondary | ICD-10-CM

## 2020-10-01 ENCOUNTER — Ambulatory Visit
Admission: RE | Admit: 2020-10-01 | Discharge: 2020-10-01 | Disposition: A | Discharge status: Home / Self Care | Payer: BC Managed Care – PPO | Source: Ambulatory Visit | Attending: Neurology | Admitting: Neurology

## 2020-10-01 ENCOUNTER — Other Ambulatory Visit: Payer: Self-pay

## 2020-10-01 ENCOUNTER — Other Ambulatory Visit: Payer: Self-pay | Admitting: Neurology

## 2020-10-01 DIAGNOSIS — H02401 Unspecified ptosis of right eyelid: Secondary | ICD-10-CM

## 2020-10-01 MED ORDER — GADOBUTROL 1 MMOL/ML IV SOLN
10.0000 mL | Freq: Once | INTRAVENOUS | Status: AC | PRN
  Administered 2020-10-01: 10 mL via INTRAVENOUS

## 2020-10-01 MED ORDER — IOHEXOL 300 MG/ML  SOLN
75.0000 mL | Freq: Once | INTRAMUSCULAR | Status: AC | PRN
  Administered 2020-10-01: 75 mL via INTRAVENOUS

## 2021-03-12 ENCOUNTER — Other Ambulatory Visit: Payer: Self-pay

## 2021-03-12 ENCOUNTER — Ambulatory Visit: Payer: Self-pay | Admitting: Nurse Practitioner

## 2021-03-12 ENCOUNTER — Encounter: Payer: Self-pay | Admitting: Nurse Practitioner

## 2021-03-12 DIAGNOSIS — Z9189 Other specified personal risk factors, not elsewhere classified: Secondary | ICD-10-CM

## 2021-03-12 NOTE — Progress Notes (Signed)
   Subjective:    Patient ID: Kurt Morse, male    DOB: [birth year], 45 y.o.   MRN: [id number]  HPI 45 year old male   Had two bites he notes on leg 10-11 days ago  that appeared dark and dissipated after 2 days did not see insect that bit him.   5-6 days after he noted that his dog had multiple ticks on his blanket-   No rash, no itching at bite location of patient  Denies any systemic symptoms (no fever)  Questions about Lyme prophylaxis and identifying ticks found in home  Telehealth appointment performed with patient's consent over the phone       Objective:   Physical Exam No physical exam performed, patient in no acute distress during phone conversation with provider.        Assessment & Plan:  Dicussed timeline for possible tick bite. Outside of window for 72 hour prophylactic one time Doxycyline.  Advised watching bit location for rash, monitoring for any other systemic symptoms and returning to clinic with any concerns.   Risk factor low as ticks were not seen on patient  No rash  No other symptoms at this time Patient may choose to have ticks tested

## 2021-05-14 ENCOUNTER — Encounter: Payer: Self-pay | Admitting: Physician Assistant

## 2021-05-14 ENCOUNTER — Encounter: Payer: BC Managed Care – PPO | Admitting: Physician Assistant

## 2021-05-14 NOTE — Progress Notes (Signed)
Erroneous encounter

## 2022-04-11 IMAGING — CT CT CHEST W/ CM
2 of 3 series · 15 of 36 positions shown, 18 images · IV contrast (omnipaque)
Comparison: None.

CLINICAL DATA: Evaluate for myasthenia gravis

EXAM:
CT CHEST WITH CONTRAST
TECHNIQUE: Multidetector CT imaging of the chest was performed during
intravenous contrast administration.
CONTRAST:  75mL OMNIPAQUE IOHEXOL 300 MG/ML  SOLN

[Series 2: axial st · axial · 0.71mm/px · z∈[-368,-66]mm · 12 of 179 slices shown, 15 images]
[im 14/179  mediastinal]
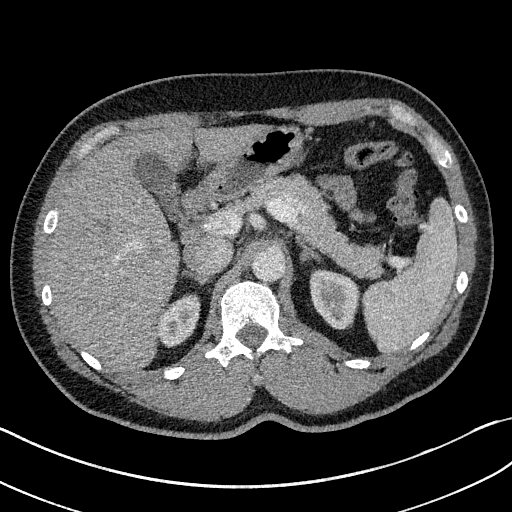
[im 14/179  lung]
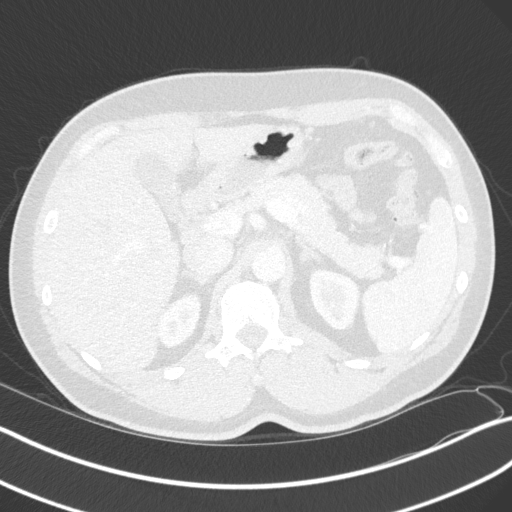
[im 27/179  lung]
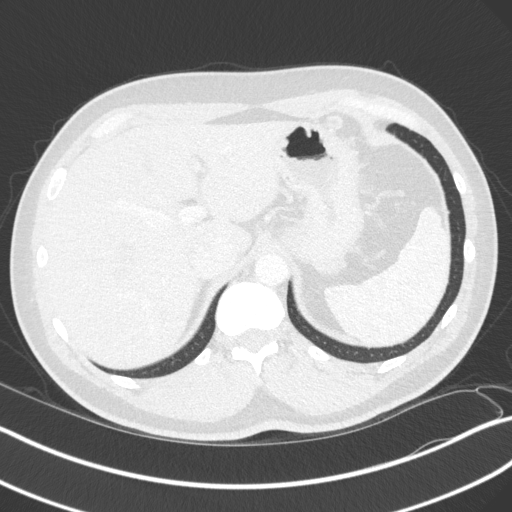
[im 40/179  lung]
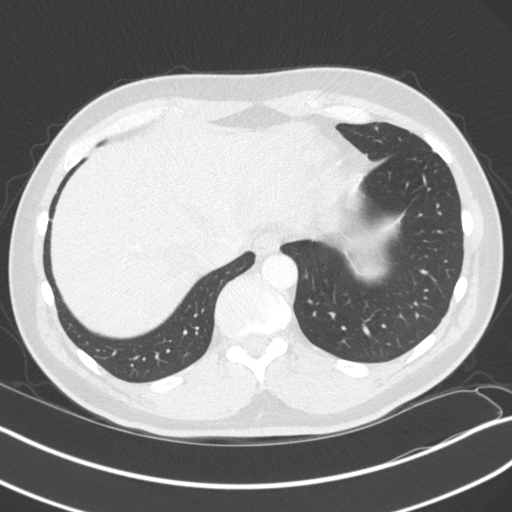
[im 53/179  lung]
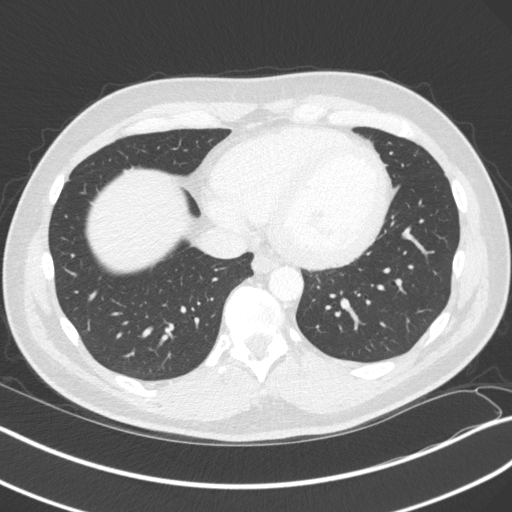
[im 66/179  mediastinal]
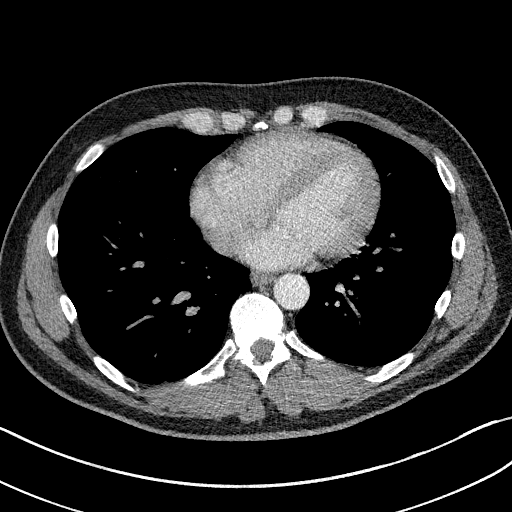
[im 66/179  lung]
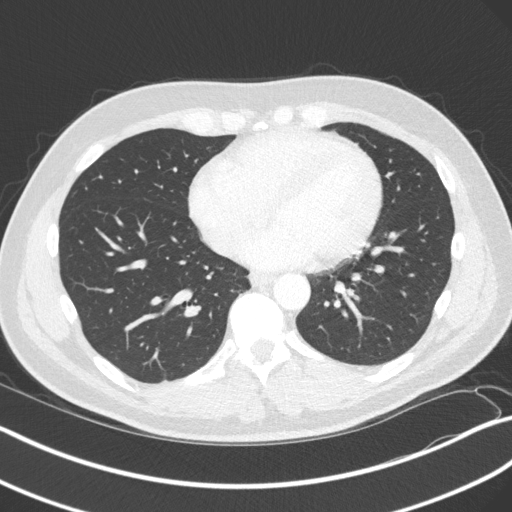
[im 80/179  lung]
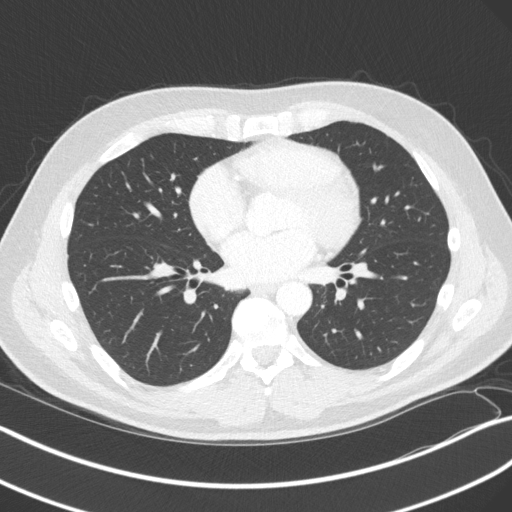
[im 99/179  lung]
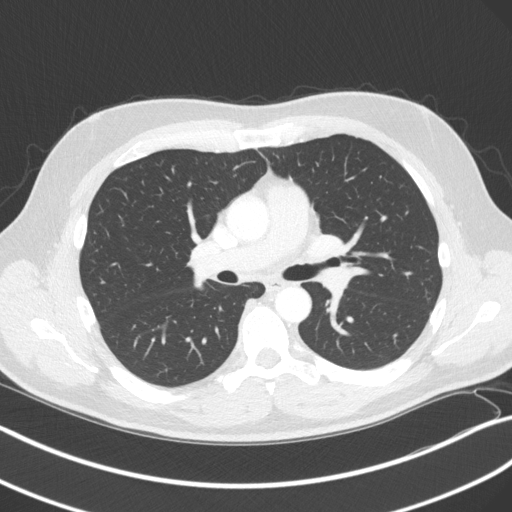
[im 113/179  lung]
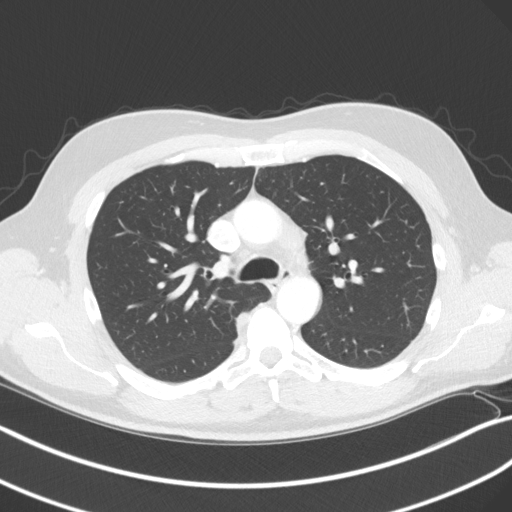
[im 126/179  mediastinal]
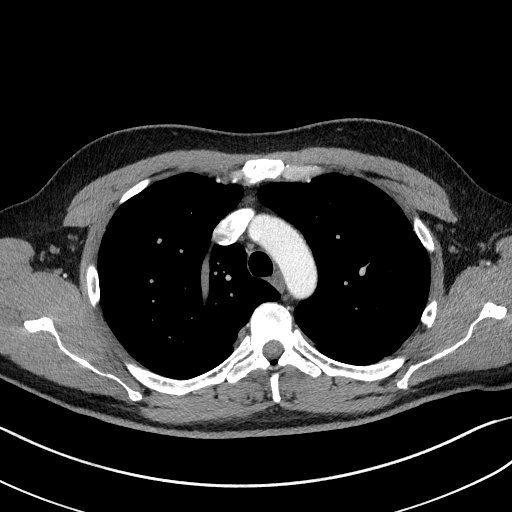
[im 126/179  lung]
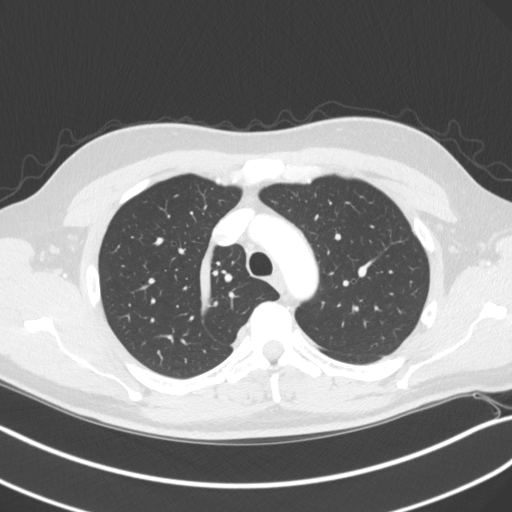
[im 139/179  lung]
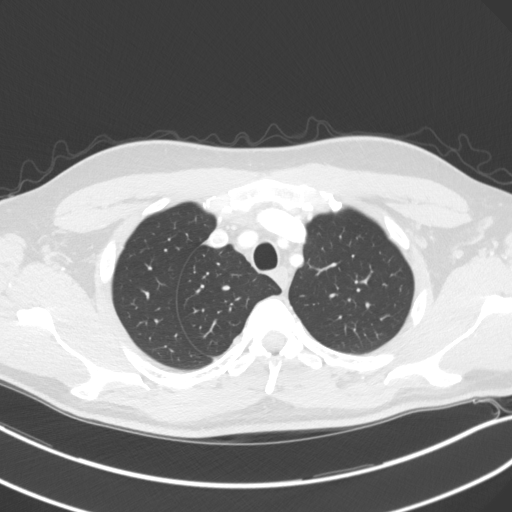
[im 152/179  lung]
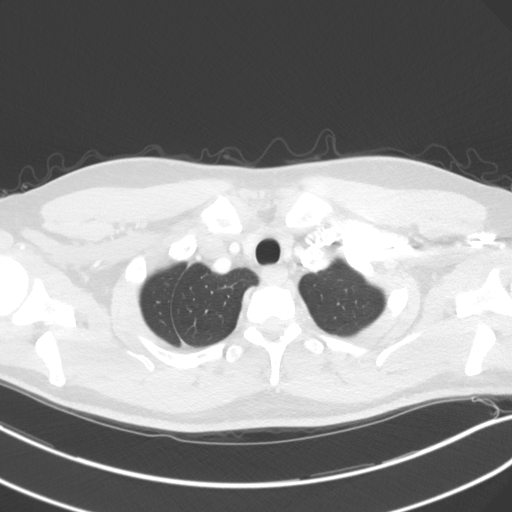
[im 165/179  lung]
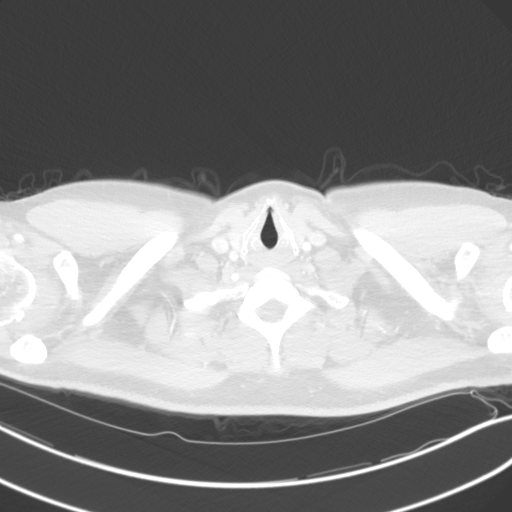

[Series 5: coronal · coronal · 0.73mm/px · 3 of 123 slices shown]
[im 25/123  lung]
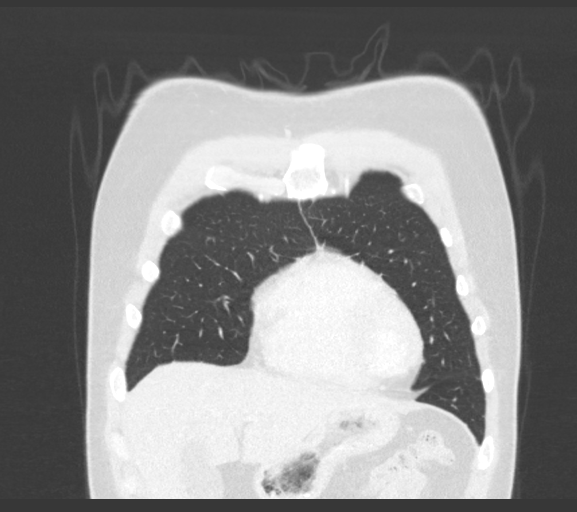
[im 49/123  lung]
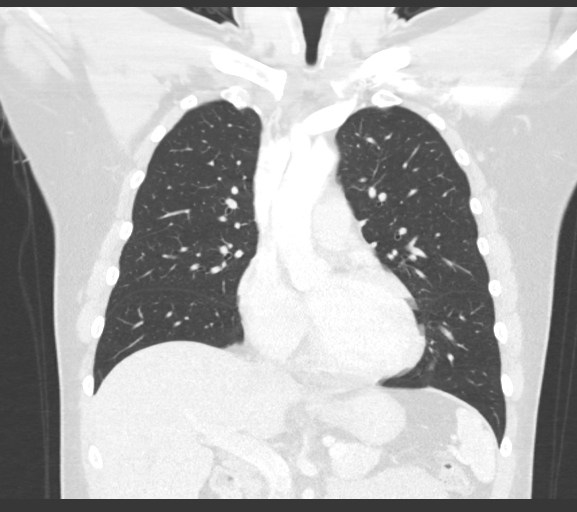
[im 74/123  lung]
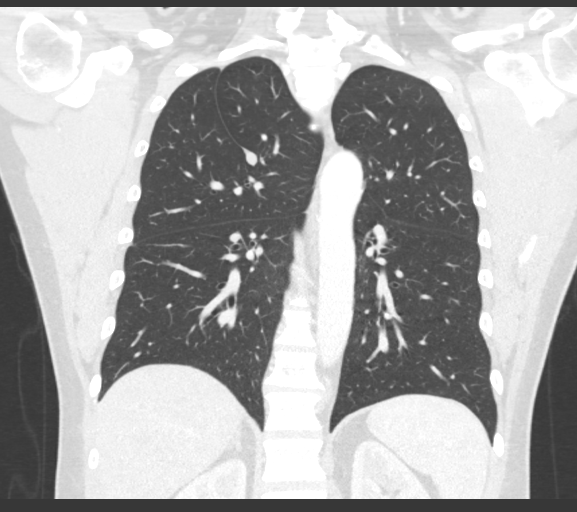

[15 of 36 positions shown; findings below may reference images not displayed]

FINDINGS: Cardiovascular: Heart is normal size. Aorta is normal caliber.

Mediastinum/Nodes: No mediastinal, hilar, or axillary adenopathy.
Trachea and esophagus are unremarkable. Thyroid unremarkable. No
anterior mediastinal mass.

Lungs/Pleura: Lungs are clear. No focal airspace opacities or
suspicious nodules. No effusions.

Upper Abdomen: Imaging into the upper abdomen demonstrates no acute
findings.

Musculoskeletal: Chest wall soft tissues are unremarkable. No acute
bony abnormality.
IMPRESSION: No acute cardiopulmonary disease.

No anterior mediastinal mass.

## 2023-04-27 ENCOUNTER — Other Ambulatory Visit: Payer: Self-pay

## 2023-04-27 ENCOUNTER — Encounter: Payer: Self-pay | Admitting: Physician Assistant

## 2023-04-27 ENCOUNTER — Ambulatory Visit (INDEPENDENT_AMBULATORY_CARE_PROVIDER_SITE_OTHER): Payer: Self-pay | Admitting: Physician Assistant

## 2023-04-27 VITALS — BP 130/82 | HR 68 | Temp 98.9°F | Ht 71.0 in | Wt 200.0 lb

## 2023-04-27 DIAGNOSIS — J069 Acute upper respiratory infection, unspecified: Secondary | ICD-10-CM

## 2023-04-27 LAB — POC SOFIA 2 FLU + SARS ANTIGEN FIA
Influenza A, POC: NEGATIVE
Influenza B, POC: NEGATIVE
SARS Coronavirus 2 Ag: NEGATIVE

## 2023-04-27 NOTE — Progress Notes (Signed)
[facility] [address] [place], [place] [address]   Office Visit Note  Patient Name: Kurt Morse Date of Birth [id number]  Medical Record number [id number]  Date of Service: 04/27/2023  Chief Complaint  Patient presents with   Acute Visit    Patient states he felt like he had some postnasal drainage yesterday. Symptoms have been getting progressively worse since then and he now reports having a dry cough, body aches, and sore throat. Denies fever. He states his middle school aged son has also been sick with similar symptoms.     47 y/o M presents to the clinic for c/o post nasal drainage, body aches, dry cough x 1 day. +family member(son)with similar symptoms. Pt has h/o allergies for which he takes Xyzal daily. PT denies CP, SOB, or wheezing.       Current Medication:  Outpatient Encounter Medications as of 04/27/2023  Medication Sig   Alpha-Lipoic Acid 600 MG TABS Take by mouth daily.   Cholecalciferol 25 MCG (1000 UT) tablet Take by mouth daily.   fexofenadine (ALLEGRA) 30 MG/5ML suspension Take 30 mg by mouth daily.   Glucosamine-Vit D-Hyaluron Acd (GLUCOSAMINE PLUS VITAMIN D3) 1000-1000-1.65 MG-UNIT-MG TABS Take by mouth daily.   Multiple Vitamin (MULTI-VITAMIN) tablet Take 1 tablet by mouth daily.   mycophenolate (CELLCEPT) 500 MG tablet Take 1,000 mg by mouth 2 (two) times daily.   [DISCONTINUED] pyridostigmine (MESTINON) 60 MG tablet Take 120 mg by mouth 3 (three) times daily.   [DISCONTINUED] pyridostigmine (MESTINON) 60 MG tablet Take by mouth.   No facility-administered encounter medications on file as of 04/27/2023.      Medical History: Past Medical History:  Diagnosis Date   History of tonsillectomy      Vital Signs: BP 130/82   Pulse 68   Temp 98.9 F (37.2 C)   Ht 5\' 11"  (1.803 m)   Wt 200 lb (90.7 kg)   SpO2 97%   BMI 27.89 kg/m    Review of Systems  Constitutional: Negative.   HENT:  Positive for congestion and postnasal  drip. Negative for sinus pressure, sinus pain, sore throat and trouble swallowing.   Respiratory: Negative.    Cardiovascular: Negative.   Neurological: Negative.     Physical Exam Constitutional:      Appearance: Normal appearance.  HENT:     Head: Atraumatic.     Right Ear: Tympanic membrane, ear canal and external ear normal.     Left Ear: Tympanic membrane, ear canal and external ear normal.     Nose: Nose normal.     Mouth/Throat:     Mouth: Mucous membranes are moist.     Pharynx: Oropharynx is clear.  Eyes:     Extraocular Movements: Extraocular movements intact.  Cardiovascular:     Rate and Rhythm: Normal rate and regular rhythm.  Pulmonary:     Effort: Pulmonary effort is normal.     Breath sounds: Normal breath sounds.  Musculoskeletal:     Cervical back: Neck supple.  Skin:    General: Skin is warm.  Neurological:     Mental Status: He is alert.  Psychiatric:        Mood and Affect: Mood normal.        Behavior: Behavior normal.        Thought Content: Thought content normal.        Judgment: Judgment normal.       Assessment/Plan:  1. Viral upper respiratory tract infection - POC SOFIA  2 FLU + SARS ANTIGEN FIA  Reviewed negative covid and flu test results with patient. He verbalized understanding. Increase fluids Take otc multi-symptom medicine Continue to watch for worsening symptoms Consider repeat covid testing in 1-2 days if symptoms worsen Reviewed CDC covid recommendations with patient. Pt verbalized understanding and in agreement.    General Counseling: glennis rauseo understanding of the findings of todays visit and agrees with plan of treatment. I have discussed any further diagnostic evaluation that may be needed or ordered today. We also reviewed his medications today. he has been encouraged to call the office with any questions or concerns that should arise related to todays visit.    Time spent:20 Minutes    Gilberto Better,  [place] Physician Assistant

## 2023-04-30 ENCOUNTER — Ambulatory Visit (INDEPENDENT_AMBULATORY_CARE_PROVIDER_SITE_OTHER): Payer: Self-pay | Admitting: Adult Health

## 2023-04-30 DIAGNOSIS — J069 Acute upper respiratory infection, unspecified: Secondary | ICD-10-CM

## 2023-04-30 MED ORDER — AZITHROMYCIN 250 MG PO TABS: ORAL_TABLET | ORAL | 0 refills | Status: AC

## 2023-04-30 NOTE — Progress Notes (Signed)
  Virtual Visit Consent   [name], you are scheduled for a virtual visit with a [facility] provider today. Just as with appointments in the office, your consent must be obtained to participate.   I need to obtain your verbal consent now. Are you willing to proceed with your visit today? [name] has provided verbal consent on 04/30/2023 for a virtual visit (video or telephone). [name], NP  Date: 04/30/2023 1:19 PM  Virtual Visit via Video Note   I, [name], connected with  Kurt Morse  ([id number], [birth year]) on 04/30/23 at  1:30 PM EDT by telephone and verified that I am speaking with the correct person using two identifiers.  Location: Patient: Virtual Visit Location Patient: Home Provider: Virtual Visit Location Provider: Office/Clinic   I discussed the limitations of evaluation and management by telemedicine and the availability of in person appointments. The patient expressed understanding and agreed to proceed.    History of Present Illness: Kurt Morse is a 47 y.o. and is being seen today for follow up on earlier visit this week. He is having increased symptoms, including productive cough, sore throat, headache, body aches.   HPI: HPI  Problems: There are no problems to display for this patient.   Allergies: No Known Allergies Medications:  Current Outpatient Medications:    Alpha-Lipoic Acid 600 MG TABS, Take by mouth daily., Disp: , Rfl:    Cholecalciferol 25 MCG (1000 UT) tablet, Take by mouth daily., Disp: , Rfl:    fexofenadine (ALLEGRA) 30 MG/5ML suspension, Take 30 mg by mouth daily., Disp: , Rfl:    Glucosamine-Vit D-Hyaluron Acd (GLUCOSAMINE PLUS VITAMIN D3) 1000-1000-1.65 MG-UNIT-MG TABS, Take by mouth daily., Disp: , Rfl:    Multiple Vitamin (MULTI-VITAMIN) tablet, Take 1 tablet by mouth daily., Disp: , Rfl:    mycophenolate (CELLCEPT) 500 MG tablet, Take 1,000 mg by mouth 2 (two) times daily., Disp: , Rfl:    Observations/Objective: No labored breathing.  Speech is clear and coherent with logical content.  Patient is alert and oriented at baseline.    Assessment and Plan: There are no diagnoses linked to this encounter.      Follow Up Instructions: I discussed the assessment and treatment plan with the patient. The patient was provided an opportunity to ask questions and all were answered. The patient agreed with the plan and demonstrated an understanding of the instructions.    The patient was advised to call back or seek an in-person evaluation if the symptoms worsen or if the condition fails to improve as anticipated.  Time:  I spent 5 minutes with the patient via telehealth technology discussing the above problems/concerns.    [name], NP

## 2023-10-30 ENCOUNTER — Ambulatory Visit (INDEPENDENT_AMBULATORY_CARE_PROVIDER_SITE_OTHER): Payer: Self-pay | Admitting: Physician Assistant

## 2023-10-30 ENCOUNTER — Other Ambulatory Visit: Payer: Self-pay

## 2023-10-30 ENCOUNTER — Encounter: Payer: Self-pay | Admitting: Physician Assistant

## 2023-10-30 VITALS — BP 130/78 | HR 74 | Temp 97.9°F | Ht 71.0 in | Wt 205.0 lb

## 2023-10-30 DIAGNOSIS — J0191 Acute recurrent sinusitis, unspecified: Secondary | ICD-10-CM

## 2023-10-30 MED ORDER — DOXYCYCLINE HYCLATE 100 MG PO CAPS: 100.0000 mg | ORAL_CAPSULE | Freq: Two times a day (BID) | ORAL | 0 refills | Status: AC

## 2023-10-30 NOTE — Progress Notes (Signed)
 [facility] [address] [place], [place] [address]   Office Visit Note  Patient Name: Kurt Morse Date of Birth [id number]  Medical Record number [id number]  Date of Service: 10/30/2023  Chief Complaint  Patient presents with   Acute Visit    Patient c/o postnasal drainage causing throat soreness, nasal congestion, and sinus pressure/pain x 2 days. Denies fever. He took Dayquil.     HPI Pt is here for a sick visit. States he's had 2-3 days of ST, PND, and coughing which became a little worse last night. Lots of facial/head pressure/pain. Rhinorrhea, BA. No fevers/chills, ear symptoms, CP, SOB, wheezing, abd pain, n/v/d/c, or any other symptoms.  Used dayquil this morning, helped a bit. Used ibuprofen as well, which helped a little.  +Sick contacts, son also sick this last week. On abx for bronchitis.  Hasn't been tested for COVID/flu, son wasn't tested either.  +Seasonal allergies.  Has myasthenia gravis and is on cellcept.    ROS: Review of Systems  Constitutional:  Negative for chills and fever.  HENT:  Positive for congestion, postnasal drip, rhinorrhea and sore throat. Negative for ear discharge and ear pain.   Respiratory:  Positive for cough. Negative for shortness of breath and wheezing.   Cardiovascular:  Negative for chest pain.  Gastrointestinal:  Negative for abdominal pain, constipation, diarrhea, nausea and vomiting.  Musculoskeletal:  Positive for myalgias.  Skin:  Negative for color change.  Allergic/Immunologic: Positive for immunocompromised state.     Current Medication:  Outpatient Encounter Medications as of 10/30/2023  Medication Sig   Alpha-Lipoic Acid 600 MG TABS Take by mouth daily.   Cholecalciferol 25 MCG (1000 UT) tablet Take by mouth daily.   doxycycline  (VIBRAMYCIN ) 100 MG capsule Take 1 capsule (100 mg total) by mouth 2 (two) times daily. One po bid x 7 days   fexofenadine (ALLEGRA) 30 MG/5ML suspension Take 30 mg by mouth daily.    Glucosamine-Vit D-Hyaluron Acd (GLUCOSAMINE PLUS VITAMIN D3) 1000-1000-1.65 MG-UNIT-MG TABS Take by mouth daily.   Multiple Vitamin (MULTI-VITAMIN) tablet Take 1 tablet by mouth daily.   mycophenolate (CELLCEPT) 500 MG tablet Take 1,000 mg by mouth 2 (two) times daily.   No facility-administered encounter medications on file as of 10/30/2023.      Medical History: Past Medical History:  Diagnosis Date   History of tonsillectomy      Vital Signs: BP 130/78   Pulse 74   Temp 97.9 F (36.6 C)   Ht 5\' 11"  (1.803 m)   Wt 93 kg   SpO2 98%   BMI 28.59 kg/m    Physical Exam Vitals and nursing note reviewed.  Constitutional:      General: He is not in acute distress.    Appearance: Normal appearance. He is well-developed. He is not toxic-appearing.     Comments: Afebrile, nontoxic, NAD  HENT:     Head: Normocephalic and atraumatic.     Nose: Congestion and rhinorrhea present. Rhinorrhea is clear.     Right Turbinates: Enlarged.     Left Turbinates: Enlarged.     Mouth/Throat:     Mouth: Mucous membranes are moist.     Pharynx: Oropharynx is clear. Uvula midline. Postnasal drip present. No pharyngeal swelling or oropharyngeal exudate.     Comments: Tonsils surgically absent Some PND Eyes:     General:        Right eye: No discharge.        Left eye: No discharge.  Conjunctiva/sclera: Conjunctivae normal.  Cardiovascular:     Rate and Rhythm: Normal rate and regular rhythm.     Pulses: Normal pulses.     Heart sounds: Normal heart sounds, S1 normal and S2 normal. No murmur heard.    No friction rub. No gallop.  Pulmonary:     Effort: Pulmonary effort is normal. No respiratory distress.     Breath sounds: Normal breath sounds. No decreased breath sounds, wheezing, rhonchi or rales.  Abdominal:     General: Bowel sounds are normal. There is no distension.     Palpations: Abdomen is soft. Abdomen is not rigid.     Tenderness: There is no abdominal tenderness. There is  no right CVA tenderness, left CVA tenderness, guarding or rebound.  Musculoskeletal:        General: Normal range of motion.     Cervical back: Normal range of motion and neck supple.  Lymphadenopathy:     Cervical: Cervical adenopathy (shotty, nontender) present.  Skin:    General: Skin is warm and dry.     Findings: No rash.  Neurological:     Mental Status: He is alert and oriented to person, place, and time.     Sensory: Sensation is intact. No sensory deficit.     Motor: Motor function is intact.  Psychiatric:        Mood and Affect: Mood and affect normal.        Behavior: Behavior normal.       Assessment/Plan:   ICD-10-CM   1. Acute recurrent sinusitis, unspecified location  J01.91        -Pt here with sinusitis symptoms x2-3 days, exam reveals pt is afebrile with a clear lung exam, mild nasal congestion and drainage, throat benign.  -offered COVID/flu testing, pt declined.   -Doubt need for other testing at this time.  -Suspect likely viral URI/infection however pt is immunosuppressed therefore will give safety script for abx-- only to take if symptoms worsen in the next 24-48hrs or if fevers occur. Rx for doxycycline  given (augmentin causes interaction with cellcept).  -Discussed OTCs for symptomatic relief and supportive care (i.e. Alternating Tylenol 1000mg  max and Ibuprofen 600-800mg  max, Chloraseptic spray/throat lozenges for sore throat, hot tea with honey/lemon for sore throat, Flonase sensimist 2 sprays each nostril once or twice daily and Netipot for congestion, antihistamines like Zyrtec/Claritin/Xyzal/etc, decongestants like Sudafed for congestion with caution, Mucinex D for cough/congestion or Mucinex DM for cough/expectoration, adequate hydration and rest, etc).  -F/up as needed or if not improving.  -Strict return/ED guidelines discussed.   General Counseling: Kurt Morse understanding of the findings of todays visit and agrees with plan of treatment. I  have discussed any further diagnostic evaluation that may be needed or ordered today. We also reviewed his medications today. he has been encouraged to call the office with any questions or concerns that should arise related to todays visit.   No orders of the defined types were placed in this encounter.  No results found for this or any previous visit (from the past 24 hours).   Meds ordered this encounter  Medications   doxycycline  (VIBRAMYCIN ) 100 MG capsule    Sig: Take 1 capsule (100 mg total) by mouth 2 (two) times daily. One po bid x 7 days    Dispense:  14 capsule    Refill:  0    Supervising Provider:   BACIGALUPO, ANGELA M [[id number]]    Time spent: [address] PA-C  Fish farm manager
